# Patient Record
Sex: Female | Born: 2000 | Race: White | Hispanic: No | Marital: Single | Smoking: Never smoker
Health system: Southern US, Community
[De-identification: ages and names within clinical notes are randomized; demographics above are authoritative.]

## PROBLEM LIST (undated history)

## (undated) DIAGNOSIS — E559 Vitamin D deficiency, unspecified: Secondary | ICD-10-CM

## (undated) DIAGNOSIS — N946 Dysmenorrhea, unspecified: Secondary | ICD-10-CM

## (undated) DIAGNOSIS — D649 Anemia, unspecified: Secondary | ICD-10-CM

## (undated) DIAGNOSIS — K760 Fatty (change of) liver, not elsewhere classified: Secondary | ICD-10-CM

## (undated) DIAGNOSIS — J309 Allergic rhinitis, unspecified: Secondary | ICD-10-CM

## (undated) DIAGNOSIS — N921 Excessive and frequent menstruation with irregular cycle: Principal | ICD-10-CM

## (undated) DIAGNOSIS — R945 Abnormal results of liver function studies: Secondary | ICD-10-CM

## (undated) HISTORY — DX: Fatty (change of) liver, not elsewhere classified: K76.0

## (undated) HISTORY — DX: Dysmenorrhea, unspecified: N94.6

## (undated) HISTORY — DX: Excessive and frequent menstruation with irregular cycle: N92.1

## (undated) HISTORY — DX: Allergic rhinitis, unspecified: J30.9

## (undated) HISTORY — DX: Anemia, unspecified: D64.9

## (undated) HISTORY — DX: Abnormal results of liver function studies: R94.5

## (undated) HISTORY — DX: Vitamin D deficiency, unspecified: E55.9

---
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
## 2021-10-08 ENCOUNTER — Other Ambulatory Visit: Payer: Self-pay | Admitting: Adult Health

## 2021-10-09 ENCOUNTER — Encounter: Payer: Self-pay | Admitting: Adult Health

## 2021-10-09 ENCOUNTER — Ambulatory Visit (INDEPENDENT_AMBULATORY_CARE_PROVIDER_SITE_OTHER): Payer: 59 | Admitting: Adult Health

## 2021-10-09 VITALS — BP 120/78 | HR 81 | Ht 69.25 in | Wt 236.5 lb

## 2021-10-09 DIAGNOSIS — Z7689 Persons encountering health services in other specified circumstances: Secondary | ICD-10-CM

## 2021-10-09 DIAGNOSIS — Z3041 Encounter for surveillance of contraceptive pills: Secondary | ICD-10-CM

## 2021-10-09 DIAGNOSIS — Z113 Encounter for screening for infections with a predominantly sexual mode of transmission: Secondary | ICD-10-CM | POA: Diagnosis not present

## 2021-10-09 MED ORDER — NORETHIN ACE-ETH ESTRAD-FE 1-20 MG-MCG PO TABS: 1.0000 | ORAL_TABLET | Freq: Every day | ORAL | 4 refills | Status: DC

## 2021-10-09 NOTE — Progress Notes (Signed)
  Subjective:     Patient ID: Jenetta Loges, female   DOB: 01-Dec-2000, 21 y.o.   MRN: [id number]  HPI Nayelly is a 21 year old white female,single, G0P0, in to get OCs refilled. Periods good. 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   Review of Systems Patient denies any headaches, hearing loss, fatigue, blurred vision, shortness of breath, chest pain, abdominal pain, problems with bowel movements, urination, or intercourse(has never had sex). No joint pain or mood swings.  Periods good.    Reviewed past medical,surgical, social and family history. Reviewed medications and allergies.  Objective:   Physical Exam BP 120/78 (BP Location: Left Arm, Patient Position: Sitting, Cuff Size: Large)   Pulse 81   Ht 5' 9.25" (1.759 m)   Wt 236 lb 8 oz (107.3 kg)   LMP 09/14/2021   BMI 34.67 kg/m     Skin warm and dry. Lungs: clear to ausculation bilaterally. Cardiovascular: regular rate and rhythm.  AA is 0 Fall risk is low    10/09/2021    3:37 PM 03/23/2019    3:29 PM 03/19/2016   11:04 AM  Depression screen PHQ 2/9  Decreased Interest 0 0 0  Down, Depressed, Hopeless 0 0 0  PHQ - 2 Score 0 0 0  Altered sleeping 0  0  Tired, decreased energy 0  1  Change in appetite 0  1  Feeling bad or failure about yourself  0  0  Trouble concentrating 0  0  Moving slowly or fidgety/restless 0  0  Suicidal thoughts 0  0  PHQ-9 Score 0  2       10/09/2021    3:37 PM  GAD 7 : Generalized Anxiety Score  Nervous, Anxious, on Edge 0  Control/stop worrying 0  Worry too much - different things 0  Trouble relaxing 0  Restless 0  Easily annoyed or irritable 0  Afraid - awful might happen 0  Total GAD 7 Score 0      Upstream - 10/09/21 1533       Pregnancy Intention Screening   Does the patient want to become pregnant in the next year? No    Does the patient's partner want to become pregnant in the next year? No    Would the patient like to discuss contraceptive options today? No      Contraception  Wrap Up   Current Method Abstinence;Oral Contraceptive    End Method Abstinence;Oral Contraceptive             Assessment:     1. Screening examination for STD (sexually transmitted disease) GC/CHL sent on urine  2. Encounter for surveillance of contraceptive pills Periods good on Blisovi 1/20 will refill  Meds ordered this encounter  Medications   norethindrone-ethinyl estradiol-FE (BLISOVI FE 1/20) 1-20 MG-MCG tablet    Sig: Take 1 tablet by mouth daily.    Dispense:  84 tablet    Refill:  4    Order Specific Question:   Supervising Provider    Answer:   Elonda Husky, LUTHER H [[id number]]     3. Encounter for menstrual regulation Periods good on Blisovi    Plan:     Return in 1 year for pap and physical

## 2021-10-11 ENCOUNTER — Telehealth: Payer: Self-pay | Admitting: *Deleted

## 2021-10-11 LAB — GC/CHLAMYDIA PROBE AMP
Chlamydia trachomatis, NAA: NEGATIVE
Neisseria Gonorrhoeae by PCR: NEGATIVE

## 2021-10-11 NOTE — Telephone Encounter (Signed)
Left message @ 12:41 pm. [phone]. Roxana

## 2021-10-11 NOTE — Telephone Encounter (Signed)
Pt aware GC/CHL is negative. Pt voiced understanding. Deanna Lewis

## 2021-10-11 NOTE — Telephone Encounter (Signed)
-----   Message from Estill Dooms, NP sent at 10/11/2021 11:29 AM EDT ----- Let her know GC/CHL negative

## 2022-12-02 ENCOUNTER — Other Ambulatory Visit: Payer: Self-pay | Admitting: Adult Health

## 2023-05-15 ENCOUNTER — Ambulatory Visit
Admission: EM | Admit: 2023-05-15 | Discharge: 2023-05-15 | Disposition: A | Discharge status: Home / Self Care | Payer: Commercial Managed Care - PPO | Attending: Nurse Practitioner | Admitting: Nurse Practitioner

## 2023-05-15 DIAGNOSIS — J069 Acute upper respiratory infection, unspecified: Secondary | ICD-10-CM

## 2023-05-15 LAB — POC COVID19/FLU A&B COMBO
Covid Antigen, POC: NEGATIVE
Influenza A Antigen, POC: NEGATIVE
Influenza B Antigen, POC: NEGATIVE

## 2023-05-15 MED ORDER — ONDANSETRON 4 MG PO TBDP: 4.0000 mg | ORAL_TABLET | Freq: Three times a day (TID) | ORAL | 0 refills | Status: AC | PRN

## 2023-05-15 MED ORDER — BENZONATATE 100 MG PO CAPS
100.0000 mg | ORAL_CAPSULE | Freq: Three times a day (TID) | ORAL | 0 refills | Status: AC | PRN
Start: 2023-05-15 — End: ?

## 2023-05-15 NOTE — Discharge Instructions (Signed)
You have a viral upper respiratory infection.  Symptoms should improve over the next week to 10 days.  If you develop chest pain or shortness of breath, go to the emergency room.  COVID-19 and influenza test is negative today.  Some things that can make you feel better are: - Increased rest - Increasing fluid with water/sugar free electrolytes - Acetaminophen and ibuprofen as needed for fever/pain - Salt water gargling, chloraseptic spray and throat lozenges for sore throat - OTC guaifenesin (Mucinex) 600 mg twice daily for congestion - Saline sinus flushes or a neti pot - Humidifying the air -Tessalon Perles every 8 hours as needed for dry cough  - Zofran 4 mg every 8 hours as needed for nausea/vomiting

## 2023-05-15 NOTE — ED Provider Notes (Signed)
[facility]    CSN: [id number] Arrival date & time: 05/15/23  1191      History   Chief Complaint No chief complaint on file.   HPI Deanna Lewis is a 23 y.o. female.   Patient presents today with 1 day history of bodyaches, temperature max at home 99.1 F, dry cough, sore throat, pressure behind her eyes, headache, abdominal pain and nausea, decreased appetite, and fatigue.  No shortness of breath or chest pain, runny or stuffy nose, vomiting, or diarrhea.  Has not taken anything for symptoms so far.  Has been able to tolerate sips of water today  She works at a local emergency room.    Past Medical History:  Diagnosis Date   Allergic rhinitis 11/16/2012   Anemia    Dysmenorrhea 07/04/2015   Elevated liver function tests 07/04/2015   Metrorrhagia 07/04/2015   Steatosis of liver 07/10/2015   Vitamin D deficiency disease     Patient Active Problem List   Diagnosis Date Noted   Encounter for menstrual regulation 10/09/2021   Screening examination for STD (sexually transmitted disease) 10/09/2021   Encounter for surveillance of contraceptive pills 08/10/2020   Elevated liver enzymes 03/19/2016   Steatosis of liver 07/10/2015   Metrorrhagia 07/04/2015   Elevated liver function tests 07/04/2015   Dysmenorrhea 07/04/2015   Allergic rhinitis 11/16/2012    Past Surgical History:  Procedure Laterality Date   LIVER BIOPSY  09/23/2016   tooth extraction Left 06/22/2020    OB History     Gravida  0   Para  0   Term  0   Preterm  0   AB  0   Living  0      SAB  0   IAB  0   Ectopic  0   Multiple  0   Live Births               Home Medications    Prior to Admission medications   Medication Sig Start Date End Date Taking? Authorizing Provider  benzonatate (TESSALON) 100 MG capsule Take 1 capsule (100 mg total) by mouth 3 (three) times daily as needed for cough. Do not take with alcohol or while operating or driving heavy machinery 4/78/29   Yes Cathlean Marseilles A, NP  ondansetron (ZOFRAN-ODT) 4 MG disintegrating tablet Take 1 tablet (4 mg total) by mouth every 8 (eight) hours as needed for vomiting or nausea. 05/15/23  Yes Valentino Nose, NP  acetaminophen (TYLENOL) 325 MG tablet Take 325 mg by mouth as needed.    [provider]  cholecalciferol (VITAMIN D) 1000 units tablet Take 1,000 Units by mouth.    [provider]  fexofenadine (ALLEGRA) 60 MG tablet Take 60 mg by mouth 2 (two) times daily.    [provider]  fluticasone (FLONASE) 50 MCG/ACT nasal spray Place 1 spray into both nostrils daily as needed for allergies or rhinitis.     [provider]  ibuprofen (ADVIL,MOTRIN) 200 MG tablet Take 200-400 mg by mouth as needed.    [provider]  Multiple Vitamin (MULTIVITAMIN) tablet Take 1 tablet by mouth daily.    [provider]  norethindrone-ethinyl estradiol-FE (BLISOVI FE 1/20) 1-20 MG-MCG tablet TAKE 1 TABLET BY MOUTH DAILY 12/02/22   Cyril Mourning A, NP  sodium chloride (OCEAN) 0.65 % SOLN nasal spray Place 1 spray into both nostrils as needed for congestion.    [provider]    Family History  Family History  Problem Relation Age of Onset   Hyperlipidemia Paternal Grandfather    Hypertension Paternal Grandfather    Heart disease Paternal Grandfather    Heart disease Paternal Grandmother    Heart attack Paternal Grandmother    Hyperlipidemia Maternal Grandmother    Hypertension Maternal Grandmother    Hyperlipidemia Maternal Grandfather    Asthma Maternal Grandfather    Other Maternal Grandfather        heart flutter   Diabetes Father    Healthy Mother     Social History Social History   Tobacco Use   Smoking status: Never   Smokeless tobacco: Never  Vaping Use   Vaping status: Never Used  Substance Use Topics   Alcohol use: No   Drug use: No     Allergies   Patient has no known allergies.   Review of Systems Review of  Systems Per HPI  Physical Exam Triage Vital Signs ED Triage Vitals  Encounter Vitals Group     BP 05/15/23 0907 134/84     Systolic BP Percentile --      Diastolic BP Percentile --      Pulse Rate 05/15/23 0907 74     Resp 05/15/23 0907 20     Temp 05/15/23 0907 98.5 F (36.9 C)     Temp Source 05/15/23 0907 Oral     SpO2 05/15/23 0907 97 %     Weight --      Height --      Head Circumference --      Peak Flow --      Pain Score 05/15/23 0908 5     Pain Loc --      Pain Education --      Exclude from Growth Chart --    No data found.  Updated Vital Signs BP 134/84 (BP Location: Right Arm)   Pulse 74   Temp 98.5 F (36.9 C) (Oral)   Resp 20   LMP 04/30/2023   SpO2 97%   Visual Acuity Right Eye Distance:   Left Eye Distance:   Bilateral Distance:    Right Eye Near:   Left Eye Near:    Bilateral Near:     Physical Exam Vitals and nursing note reviewed.  Constitutional:      General: She is not in acute distress.    Appearance: Normal appearance. She is not ill-appearing or toxic-appearing.  HENT:     Head: Normocephalic and atraumatic.     Right Ear: Tympanic membrane, ear canal and external ear normal.     Left Ear: Tympanic membrane, ear canal and external ear normal.     Nose: No congestion or rhinorrhea.     Mouth/Throat:     Mouth: Mucous membranes are moist.     Pharynx: Oropharynx is clear. Posterior oropharyngeal erythema present. No oropharyngeal exudate.     Comments: Post nasal drainage Eyes:     General: No scleral icterus.    Extraocular Movements: Extraocular movements intact.  Cardiovascular:     Rate and Rhythm: Normal rate and regular rhythm.  Pulmonary:     Effort: Pulmonary effort is normal. No respiratory distress.     Breath sounds: Normal breath sounds. No wheezing, rhonchi or rales.  Abdominal:     General: Abdomen is flat. Bowel sounds are normal. There is no distension.     Palpations: Abdomen is soft.     Tenderness: There  is abdominal tenderness (generalized). There is no right CVA tenderness,  left CVA tenderness, guarding or rebound.  Musculoskeletal:     Cervical back: Normal range of motion and neck supple.  Lymphadenopathy:     Cervical: No cervical adenopathy.  Skin:    General: Skin is warm and dry.     Coloration: Skin is not jaundiced or pale.     Findings: No erythema or rash.  Neurological:     Mental Status: She is alert and oriented to person, place, and time.  Psychiatric:        Behavior: Behavior is cooperative.      UC Treatments / Results  Labs (all labs ordered are listed, but only abnormal results are displayed) Labs Reviewed  POC COVID19/FLU A&B COMBO - Normal    EKG   Radiology No results found.  Procedures Procedures (including critical care time)  Medications Ordered in UC Medications - No data to display  Initial Impression / Assessment and Plan / UC Course  I have reviewed the triage vital signs and the nursing notes.  Pertinent labs & imaging results that were available during my care of the patient were reviewed by me and considered in my medical decision making (see chart for details).   Patient is well-appearing, normotensive, afebrile, not tachycardic, not tachypneic, oxygenating well on room air.    1. Viral URI with cough Viral etiology Vitals and exam are stable and reassuring COVID-19 and influenza testing is negative Supportive care discussed with patient, start antiemetic, cough suppressant medication Return and ER precautions discussed Work excuse provided  The patient was given the opportunity to ask questions.  All questions answered to their satisfaction.  The patient is in agreement to this plan.    Final Clinical Impressions(s) / UC Diagnoses   Final diagnoses:  Viral URI with cough     Discharge Instructions      You have a viral upper respiratory infection.  Symptoms should improve over the next week to 10 days.  If you develop  chest pain or shortness of breath, go to the emergency room.  COVID-19 and influenza test is negative today.  Some things that can make you feel better are: - Increased rest - Increasing fluid with water/sugar free electrolytes - Acetaminophen and ibuprofen as needed for fever/pain - Salt water gargling, chloraseptic spray and throat lozenges for sore throat - OTC guaifenesin (Mucinex) 600 mg twice daily for congestion - Saline sinus flushes or a neti pot - Humidifying the air -Tessalon Perles every 8 hours as needed for dry cough  - Zofran 4 mg every 8 hours as needed for nausea/vomiting     ED Prescriptions     Medication Sig Dispense Auth. Provider   ondansetron (ZOFRAN-ODT) 4 MG disintegrating tablet Take 1 tablet (4 mg total) by mouth every 8 (eight) hours as needed for vomiting or nausea. 20 tablet Cathlean Marseilles A, NP   benzonatate (TESSALON) 100 MG capsule Take 1 capsule (100 mg total) by mouth 3 (three) times daily as needed for cough. Do not take with alcohol or while operating or driving heavy machinery 21 capsule Valentino Nose, NP      PDMP not reviewed this encounter.   Valentino Nose, NP 05/15/23 1021

## 2023-05-15 NOTE — ED Triage Notes (Addendum)
Pt reports she has some nausea, throat pain, abdominal pain, dizziness, body aches, and pressure behind her eyes x 1 day   Took covid test yesterday

## 2023-05-29 ENCOUNTER — Other Ambulatory Visit (HOSPITAL_COMMUNITY): Payer: Self-pay

## 2023-05-29 MED ORDER — NORETHIN ACE-ETH ESTRAD-FE 1-20 MG-MCG PO TABS: 1.0000 | ORAL_TABLET | Freq: Every day | ORAL | 4 refills | Status: AC

## 2023-05-29 MED ORDER — NURTEC 75 MG PO TBDP
75.0000 mg | ORAL_TABLET | Freq: Every day | ORAL | 1 refills | Status: AC | PRN
Start: 2022-11-19 — End: ?
  Filled 2023-05-29: qty 8, 30d supply, fill #0

## 2023-05-29 MED FILL — Norethindrone Ace & Ethinyl Estradiol-FE Tab 1 MG-20 MCG: ORAL | 84 days supply | Qty: 84 | Fill #0 | Status: AC

## 2023-06-08 ENCOUNTER — Other Ambulatory Visit (HOSPITAL_COMMUNITY): Payer: Self-pay

## 2023-09-22 ENCOUNTER — Other Ambulatory Visit (HOSPITAL_COMMUNITY): Payer: Self-pay

## 2023-09-22 MED FILL — Norethindrone Ace & Ethinyl Estradiol-FE Tab 1 MG-20 MCG: ORAL | 84 days supply | Qty: 84 | Fill #1 | Status: AC

## 2023-09-28 ENCOUNTER — Other Ambulatory Visit (HOSPITAL_COMMUNITY): Payer: Self-pay

## 2023-10-22 DIAGNOSIS — D509 Iron deficiency anemia, unspecified: Secondary | ICD-10-CM | POA: Diagnosis not present

## 2023-10-22 DIAGNOSIS — E782 Mixed hyperlipidemia: Secondary | ICD-10-CM | POA: Diagnosis not present

## 2023-10-29 ENCOUNTER — Other Ambulatory Visit (HOSPITAL_COMMUNITY): Payer: Self-pay

## 2023-10-29 MED ORDER — BUSPIRONE HCL 5 MG PO TABS
5.0000 mg | ORAL_TABLET | Freq: Two times a day (BID) | ORAL | 0 refills | Status: DC | PRN
  Filled 2023-10-29: qty 30, 15d supply, fill #0

## 2023-10-29 MED ORDER — NORETHIN ACE-ETH ESTRAD-FE 1-20 MG-MCG PO TABS
1.0000 | ORAL_TABLET | Freq: Every day | ORAL | 5 refills | Status: AC
  Filled 2023-10-29 – 2023-12-07 (×3): qty 84, 84d supply, fill #0
  Filled 2024-03-11: qty 84, 84d supply, fill #1

## 2023-10-29 MED ORDER — CITALOPRAM HYDROBROMIDE 20 MG PO TABS
20.0000 mg | ORAL_TABLET | Freq: Every day | ORAL | 0 refills | Status: DC
  Filled 2023-10-29: qty 30, 30d supply, fill #0

## 2023-11-19 ENCOUNTER — Other Ambulatory Visit: Payer: Self-pay

## 2023-11-19 ENCOUNTER — Other Ambulatory Visit (HOSPITAL_COMMUNITY): Payer: Self-pay

## 2023-11-19 MED ORDER — CITALOPRAM HYDROBROMIDE 20 MG PO TABS
20.0000 mg | ORAL_TABLET | Freq: Every day | ORAL | 1 refills | Status: DC
  Filled 2023-11-23: qty 90, 90d supply, fill #0
  Filled 2024-02-29: qty 90, 90d supply, fill #1

## 2023-11-19 MED ORDER — BUSPIRONE HCL 5 MG PO TABS
5.0000 mg | ORAL_TABLET | Freq: Two times a day (BID) | ORAL | 1 refills | Status: DC | PRN
  Filled 2023-11-19: qty 90, 45d supply, fill #0
  Filled 2024-02-29: qty 90, 45d supply, fill #1

## 2023-11-23 ENCOUNTER — Other Ambulatory Visit (HOSPITAL_COMMUNITY): Payer: Self-pay

## 2023-11-24 ENCOUNTER — Other Ambulatory Visit: Payer: Self-pay

## 2023-12-07 ENCOUNTER — Other Ambulatory Visit (HOSPITAL_COMMUNITY): Payer: Self-pay

## 2024-02-29 ENCOUNTER — Other Ambulatory Visit (HOSPITAL_COMMUNITY): Payer: Self-pay

## 2024-03-06 ENCOUNTER — Emergency Department (HOSPITAL_COMMUNITY)

## 2024-03-06 ENCOUNTER — Encounter (HOSPITAL_COMMUNITY): Payer: Self-pay

## 2024-03-06 ENCOUNTER — Emergency Department (HOSPITAL_COMMUNITY)
Admission: EM | Admit: 2024-03-06 | Discharge: 2024-03-06 | Disposition: A | Discharge status: Home / Self Care | Attending: Emergency Medicine | Admitting: Emergency Medicine

## 2024-03-06 ENCOUNTER — Other Ambulatory Visit: Payer: Self-pay

## 2024-03-06 DIAGNOSIS — S5002XA Contusion of left elbow, initial encounter: Secondary | ICD-10-CM

## 2024-03-06 NOTE — ED Provider Notes (Signed)
 [place] [facility] Provider Note   CSN: [id number] Arrival date & time: 03/06/24  0421     Patient presents with: Elbow Injury   Deanna Lewis is a 23 y.o. female.   23 year old female presents to the emergency department for evaluation of left elbow pain.  While at work, patient had her left arm and elbow slammed into the door near the handle.  She has been having constant pain which is aggravated with palpation and movement.  No medications taken.  The history is provided by the patient. No language interpreter was used.       Prior to Admission medications   Medication Sig Start Date End Date Taking? Authorizing Provider  acetaminophen (TYLENOL) 325 MG tablet Take 325 mg by mouth as needed.    [provider]  benzonatate  (TESSALON ) 100 MG capsule Take 1 capsule (100 mg total) by mouth 3 (three) times daily as needed for cough. Do not take with alcohol or while operating or driving heavy machinery 7/85/74   Chandra Harlene LABOR, NP  busPIRone  (BUSPAR ) 5 MG tablet Take 1 tablet (5 mg total) by mouth 2 (two) times daily as needed. 11/19/23     cholecalciferol (VITAMIN D ) 1000 units tablet Take 1,000 Units by mouth.    [provider]  citalopram  (CELEXA ) 20 MG tablet Take 1 tablet (20 mg total) by mouth daily. 11/19/23     fexofenadine (ALLEGRA) 60 MG tablet Take 60 mg by mouth 2 (two) times daily.    [provider]  fluticasone (FLONASE) 50 MCG/ACT nasal spray Place 1 spray into both nostrils daily as needed for allergies or rhinitis.     [provider]  ibuprofen (ADVIL,MOTRIN) 200 MG tablet Take 200-400 mg by mouth as needed.    [provider]  Multiple Vitamin (MULTIVITAMIN) tablet Take 1 tablet by mouth daily.    [provider]  norethindrone -ethinyl estradiol -FE (BLISOVI  FE 1/20) 1-20 MG-MCG tablet Take 1 tablet by mouth daily. 10/27/22     norethindrone -ethinyl estradiol -FE (BLISOVI   FE 1/20) 1-20 MG-MCG tablet Take 1 tablet by mouth daily. 10/29/23     ondansetron  (ZOFRAN -ODT) 4 MG disintegrating tablet Take 1 tablet (4 mg total) by mouth every 8 (eight) hours as needed for vomiting or nausea. 05/15/23   Chandra Harlene LABOR, NP  Rimegepant Sulfate  (NURTEC) 75 MG TBDP Take 1 tablet (75 mg total) by mouth daily as needed. *Max 8 tablets per month* 11/19/22     sodium chloride  (OCEAN) 0.65 % SOLN nasal spray Place 1 spray into both nostrils as needed for congestion.    [provider]    Allergies: Patient has no known allergies.    Review of Systems Ten systems reviewed and are negative for acute change, except as noted in the HPI.    Updated Vital Signs Wt 100.2 kg   LMP 02/05/2024 (Approximate)   BMI 32.40 kg/m   Physical Exam Vitals and nursing note reviewed.  Constitutional:      General: She is not in acute distress.    Appearance: She is well-developed. She is not diaphoretic.     Comments: Nontoxic appearing and in NAD  HENT:     Head: Normocephalic and atraumatic.  Eyes:     General: No scleral icterus.    Conjunctiva/sclera: Conjunctivae normal.  Cardiovascular:     Rate and Rhythm: Normal rate and regular rhythm.     Pulses: Normal pulses.     Comments: Distal radial  pulse 2+ in the left upper extremity Pulmonary:     Effort: Pulmonary effort is normal. No respiratory distress.     Comments: Respirations even and unlabored Musculoskeletal:        General: Normal range of motion.     Cervical back: Normal range of motion.     Comments: Preserved active and passive range of motion of the left upper extremity.  There is tenderness just superior to the olecranon of the left elbow.  No bony deformity or crepitus noted.  Skin:    General: Skin is warm and dry.     Coloration: Skin is not pale.     Findings: No erythema or rash.  Neurological:     Mental Status: She is alert and oriented to person, place, and time.     Coordination:  Coordination normal.  Psychiatric:        Behavior: Behavior normal.     (all labs ordered are listed, but only abnormal results are displayed) Labs Reviewed - No data to display  EKG: None  Radiology: DG Elbow Complete Left Result Date: 03/06/2024 EXAM: 3 VIEW(S) XRAY OF THE LEFT ELBOW COMPARISON: None available. CLINICAL HISTORY: injury FINDINGS: BONES AND JOINTS: No acute fracture. No malalignment. SOFT TISSUES: The soft tissues are unremarkable. IMPRESSION: 1. No evidence of acute traumatic injury. Electronically signed by: Dorethia Molt MD 03/06/2024 05:17 AM EST RP Workstation: [id number]     Procedures   Medications Ordered in the ED - No data to display                                  Medical Decision Making  This patient presents to the ED for concern of L elbow pain, this involves an extensive number of treatment options, and is a complaint that carries with it a high risk of complications and morbidity.  The differential diagnosis includes fracture vs joint dislocation vs tendonitis vs contusion   Co morbidities that complicate the patient evaluation  Anemia   Additional history obtained:  Additional history obtained from medical staff   Imaging Studies ordered:  I ordered imaging studies including Xray L elbow  I independently visualized and interpreted imaging which showed no acute fracture or bony deformity I agree with the radiologist interpretation   Cardiac Monitoring:  The patient was maintained on a cardiac monitor.  I personally viewed and interpreted the cardiac monitored which showed an underlying rhythm of: NSR   Medicines ordered and prescription drug management:  I have reviewed the patients home medicines and have made adjustments as needed   Test Considered:  CT elbow   Problem List / ED Course:  Patient presents to the emergency department for evaluation of L elbow pain. Patient neurovascularly intact on exam. Imaging  negative for fracture, dislocation, bony deformity. Compartments in the affected extremity are soft.  Plan for supportive management including RICE and NSAIDs; primary care follow up as needed. Return precautions discussed and provided. Patient discharged in stable condition with no unaddressed concerns.   Reevaluation:  After the interventions noted above, I reevaluated the patient and found that they have :stayed the same   Dispostion:  After consideration of the diagnostic results and the patients response to treatment, I feel that the patent would benefit from supportive care measures with icing, NSAIDs.  Stable for follow-up with a primary doctor.       Final diagnoses:  Contusion of left elbow,  initial encounter    ED Discharge Orders     None          Keith Sor, PA-C 03/06/24 0534    Trine Raynell Moder, MD 03/06/24 0730

## 2024-03-06 NOTE — ED Triage Notes (Signed)
 Injured at work/ left arm slammed in door/ pulses present

## 2024-03-11 ENCOUNTER — Other Ambulatory Visit (HOSPITAL_COMMUNITY): Payer: Self-pay

## 2024-03-21 ENCOUNTER — Other Ambulatory Visit (HOSPITAL_COMMUNITY): Payer: Self-pay

## 2024-03-21 MED ORDER — BUSPIRONE HCL 5 MG PO TABS: 5.0000 mg | ORAL_TABLET | Freq: Two times a day (BID) | ORAL | 2 refills | Status: AC | PRN

## 2024-03-21 MED ORDER — BLISOVI FE 1/20 1-20 MG-MCG PO TABS: 1.0000 | ORAL_TABLET | Freq: Every day | ORAL | 3 refills | Status: AC

## 2024-03-21 MED ORDER — CITALOPRAM HYDROBROMIDE 20 MG PO TABS: 20.0000 mg | ORAL_TABLET | Freq: Every day | ORAL | 2 refills | Status: AC
